# Patient Record
Sex: Female | Born: 1950 | Hispanic: No | Marital: Single
Health system: Southern US, Community
[De-identification: ages and names within clinical notes are randomized; demographics above are authoritative.]

---

## 2006-07-30 ENCOUNTER — Ambulatory Visit: Payer: Self-pay

## 2008-08-11 ENCOUNTER — Ambulatory Visit: Payer: Self-pay | Admitting: Unknown Physician Specialty

## 2009-03-22 ENCOUNTER — Ambulatory Visit: Payer: Self-pay | Admitting: Gastroenterology

## 2009-07-26 ENCOUNTER — Ambulatory Visit: Payer: Self-pay | Admitting: Unknown Physician Specialty

## 2010-07-30 ENCOUNTER — Ambulatory Visit: Payer: Self-pay | Admitting: Unknown Physician Specialty

## 2010-12-16 ENCOUNTER — Ambulatory Visit: Payer: Self-pay | Admitting: Oncology

## 2010-12-19 ENCOUNTER — Ambulatory Visit: Payer: Self-pay | Admitting: Oncology

## 2011-01-02 ENCOUNTER — Ambulatory Visit: Payer: Self-pay | Admitting: Oncology

## 2011-04-10 ENCOUNTER — Ambulatory Visit: Payer: Self-pay | Admitting: Radiation Oncology

## 2011-04-14 ENCOUNTER — Ambulatory Visit: Payer: Self-pay | Admitting: Radiation Oncology

## 2011-05-04 ENCOUNTER — Ambulatory Visit: Payer: Self-pay | Admitting: Radiation Oncology

## 2011-06-04 ENCOUNTER — Ambulatory Visit: Payer: Self-pay | Admitting: Oncology

## 2011-06-04 ENCOUNTER — Ambulatory Visit: Payer: Self-pay | Admitting: Radiation Oncology

## 2011-07-05 ENCOUNTER — Ambulatory Visit: Payer: Self-pay | Admitting: Radiation Oncology

## 2011-07-22 ENCOUNTER — Ambulatory Visit: Payer: Self-pay | Admitting: Oncology

## 2011-08-04 ENCOUNTER — Ambulatory Visit: Payer: Self-pay | Admitting: Radiation Oncology

## 2011-09-04 ENCOUNTER — Ambulatory Visit: Payer: Self-pay | Admitting: Radiation Oncology

## 2011-10-04 ENCOUNTER — Ambulatory Visit: Payer: Self-pay | Admitting: Radiation Oncology

## 2011-11-04 ENCOUNTER — Ambulatory Visit: Payer: Self-pay | Admitting: Oncology

## 2011-11-04 ENCOUNTER — Ambulatory Visit: Payer: Self-pay | Admitting: Radiation Oncology

## 2011-11-07 ENCOUNTER — Ambulatory Visit: Payer: Self-pay | Admitting: Oncology

## 2011-11-07 DIAGNOSIS — Z01810 Encounter for preprocedural cardiovascular examination: Secondary | ICD-10-CM

## 2011-11-10 LAB — CBC CANCER CENTER
Basophil #: 0 x10 3/mm (ref 0.0–0.1)
Eosinophil #: 0.1 x10 3/mm (ref 0.0–0.7)
Lymphocyte #: 1.2 x10 3/mm (ref 1.0–3.6)
Lymphocyte %: 23.7 %
MCHC: 34.7 g/dL (ref 32.0–36.0)
MCV: 84 fL (ref 80–100)
Monocyte %: 5.1 %
Neutrophil %: 68.6 %
Platelet: 161 x10 3/mm (ref 150–440)
RBC: 4.05 10*6/uL (ref 3.80–5.20)
RDW: 14.7 % — ABNORMAL HIGH (ref 11.5–14.5)
WBC: 5.1 x10 3/mm (ref 3.6–11.0)

## 2011-11-10 LAB — COMPREHENSIVE METABOLIC PANEL
Alkaline Phosphatase: 135 U/L (ref 50–136)
Anion Gap: 11 (ref 7–16)
BUN: 18 mg/dL (ref 7–18)
Bilirubin,Total: 0.9 mg/dL (ref 0.2–1.0)
Calcium, Total: 10.1 mg/dL (ref 8.5–10.1)
Chloride: 101 mmol/L (ref 98–107)
Co2: 27 mmol/L (ref 21–32)
EGFR (African American): 50 — ABNORMAL LOW
EGFR (Non-African Amer.): 42 — ABNORMAL LOW
Glucose: 97 mg/dL (ref 65–99)
Osmolality: 279 (ref 275–301)
Potassium: 3.7 mmol/L (ref 3.5–5.1)
SGOT(AST): 21 U/L (ref 15–37)
Total Protein: 7.9 g/dL (ref 6.4–8.2)

## 2011-11-10 LAB — MAGNESIUM: Magnesium: 1.7 mg/dL — ABNORMAL LOW

## 2011-11-10 LAB — PHOSPHORUS: Phosphorus: 3.1 mg/dL (ref 2.5–4.9)

## 2011-12-05 ENCOUNTER — Ambulatory Visit: Payer: Self-pay | Admitting: Oncology

## 2011-12-05 ENCOUNTER — Ambulatory Visit: Payer: Self-pay | Admitting: Radiation Oncology

## 2011-12-23 LAB — CBC CANCER CENTER
Basophil %: 0.2 %
Eosinophil %: 1.1 %
HCT: 35.3 % (ref 35.0–47.0)
HGB: 12.1 g/dL (ref 12.0–16.0)
Lymphocyte #: 1 x10 3/mm (ref 1.0–3.6)
MCV: 83 fL (ref 80–100)
Monocyte %: 4.5 %
Neutrophil %: 75.3 %
Platelet: 145 x10 3/mm — ABNORMAL LOW (ref 150–440)
RBC: 4.23 10*6/uL (ref 3.80–5.20)

## 2011-12-23 LAB — COMPREHENSIVE METABOLIC PANEL
Albumin: 4.4 g/dL (ref 3.4–5.0)
Bilirubin,Total: 1.1 mg/dL — ABNORMAL HIGH (ref 0.2–1.0)
Calcium, Total: 9.9 mg/dL (ref 8.5–10.1)
Chloride: 102 mmol/L (ref 98–107)
Co2: 29 mmol/L (ref 21–32)
Creatinine: 1.22 mg/dL (ref 0.60–1.30)
EGFR (African American): 58 — ABNORMAL LOW
EGFR (Non-African Amer.): 48 — ABNORMAL LOW
Osmolality: 286 (ref 275–301)
SGOT(AST): 23 U/L (ref 15–37)
SGPT (ALT): 31 U/L
Sodium: 141 mmol/L (ref 136–145)

## 2011-12-23 LAB — T4, FREE: Free Thyroxine: 1.2 ng/dL (ref 0.76–1.46)

## 2011-12-23 LAB — TSH: Thyroid Stimulating Horm: 2.57 u[IU]/mL

## 2011-12-23 LAB — MAGNESIUM: Magnesium: 1.8 mg/dL

## 2011-12-23 LAB — PHOSPHORUS: Phosphorus: 2.9 mg/dL (ref 2.5–4.9)

## 2012-01-02 ENCOUNTER — Ambulatory Visit: Payer: Self-pay | Admitting: Internal Medicine

## 2012-01-02 ENCOUNTER — Ambulatory Visit: Payer: Self-pay | Admitting: Radiation Oncology

## 2012-01-02 ENCOUNTER — Ambulatory Visit: Payer: Self-pay | Admitting: Oncology

## 2012-01-12 ENCOUNTER — Inpatient Hospital Stay: Payer: Self-pay | Admitting: Internal Medicine

## 2012-01-12 LAB — COMPREHENSIVE METABOLIC PANEL
BUN: 24 mg/dL — ABNORMAL HIGH (ref 7–18)
Bilirubin,Total: 1.2 mg/dL — ABNORMAL HIGH (ref 0.2–1.0)
Chloride: 104 mmol/L (ref 98–107)
Co2: 24 mmol/L (ref 21–32)
Creatinine: 1.04 mg/dL (ref 0.60–1.30)
EGFR (African American): >60
Glucose: 102 mg/dL — ABNORMAL HIGH (ref 65–99)
Osmolality: 282 (ref 275–301)
SGPT (ALT): 28 U/L
Sodium: 139 mmol/L (ref 136–145)
Total Protein: 7.7 g/dL (ref 6.4–8.2)

## 2012-01-12 LAB — CBC WITH DIFFERENTIAL/PLATELET
Basophil #: 0 10*3/uL (ref 0.0–0.1)
Eosinophil #: 0.1 10*3/uL (ref 0.0–0.7)
HCT: 33 % — ABNORMAL LOW (ref 35.0–47.0)
Lymphocyte #: 1 10*3/uL (ref 1.0–3.6)
Lymphocyte %: 20.1 %
MCHC: 34.4 g/dL (ref 32.0–36.0)
MCV: 84 fL (ref 80–100)
Monocyte %: 5.3 %
Neutrophil #: 3.7 10*3/uL (ref 1.4–6.5)
Platelet: 142 10*3/uL — ABNORMAL LOW (ref 150–440)
RDW: 15 % — ABNORMAL HIGH (ref 11.5–14.5)

## 2012-01-12 LAB — URINALYSIS, COMPLETE
Bacteria: NONE SEEN
Blood: NEGATIVE
Glucose,UR: NEGATIVE mg/dL (ref 0–75)
Nitrite: NEGATIVE
Protein: NEGATIVE
RBC,UR: 2 /HPF (ref 0–5)
Specific Gravity: 1.012 (ref 1.003–1.030)
Squamous Epithelial: 9

## 2012-01-13 LAB — CBC WITH DIFFERENTIAL/PLATELET
Basophil #: 0 10*3/uL (ref 0.0–0.1)
Basophil %: 0.2 %
HCT: 31.8 % — ABNORMAL LOW (ref 35.0–47.0)
Lymphocyte #: 0.4 10*3/uL — ABNORMAL LOW (ref 1.0–3.6)
Lymphocyte %: 11.5 %
MCV: 85 fL (ref 80–100)
Monocyte #: 0 10*3/uL (ref 0.0–0.7)
Monocyte %: 0.3 %
Neutrophil #: 3.1 10*3/uL (ref 1.4–6.5)
Neutrophil %: 88 %
Platelet: 139 10*3/uL — ABNORMAL LOW (ref 150–440)
RBC: 3.75 10*6/uL — ABNORMAL LOW (ref 3.80–5.20)
RDW: 14.9 % — ABNORMAL HIGH (ref 11.5–14.5)
WBC: 3.6 10*3/uL (ref 3.6–11.0)

## 2012-01-13 LAB — BASIC METABOLIC PANEL
Anion Gap: 15 (ref 7–16)
Co2: 22 mmol/L (ref 21–32)
Creatinine: 1.27 mg/dL (ref 0.60–1.30)
EGFR (African American): 55 — ABNORMAL LOW
Glucose: 147 mg/dL — ABNORMAL HIGH (ref 65–99)
Osmolality: 289 (ref 275–301)
Potassium: 3.7 mmol/L (ref 3.5–5.1)
Sodium: 141 mmol/L (ref 136–145)

## 2012-01-13 LAB — MAGNESIUM: Magnesium: 1.7 mg/dL — ABNORMAL LOW

## 2012-01-13 LAB — PROTIME-INR: INR: 1

## 2012-01-14 LAB — CBC WITH DIFFERENTIAL/PLATELET
Basophil #: 0 10*3/uL (ref 0.0–0.1)
Eosinophil %: 0 %
HCT: 27 % — ABNORMAL LOW (ref 35.0–47.0)
Lymphocyte #: 0.3 10*3/uL — ABNORMAL LOW (ref 1.0–3.6)
MCV: 85 fL (ref 80–100)
Monocyte %: 1 %
Neutrophil %: 95.4 %
RBC: 3.16 10*6/uL — ABNORMAL LOW (ref 3.80–5.20)
WBC: 8.1 10*3/uL (ref 3.6–11.0)

## 2012-02-02 ENCOUNTER — Ambulatory Visit: Payer: Self-pay | Admitting: Oncology

## 2012-02-02 ENCOUNTER — Ambulatory Visit: Payer: Self-pay | Admitting: Internal Medicine

## 2012-02-02 ENCOUNTER — Ambulatory Visit: Payer: Self-pay | Admitting: Radiation Oncology

## 2012-02-06 LAB — COMPREHENSIVE METABOLIC PANEL
Albumin: 3.5 g/dL (ref 3.4–5.0)
Alkaline Phosphatase: 112 U/L (ref 50–136)
Anion Gap: 8 (ref 7–16)
BUN: 23 mg/dL — ABNORMAL HIGH (ref 7–18)
Bilirubin,Total: 0.7 mg/dL (ref 0.2–1.0)
Chloride: 104 mmol/L (ref 98–107)
Co2: 30 mmol/L (ref 21–32)
EGFR (Non-African Amer.): >60
Glucose: 102 mg/dL — ABNORMAL HIGH (ref 65–99)
SGOT(AST): 20 U/L (ref 15–37)
SGPT (ALT): 39 U/L
Sodium: 142 mmol/L (ref 136–145)

## 2012-02-06 LAB — CBC CANCER CENTER
Basophil #: 0 x10 3/mm (ref 0.0–0.1)
Basophil %: 0.7 %
Eosinophil #: 0.1 x10 3/mm (ref 0.0–0.7)
Eosinophil %: 5.2 %
HGB: 11.9 g/dL — ABNORMAL LOW (ref 12.0–16.0)
Lymphocyte #: 0.8 x10 3/mm — ABNORMAL LOW (ref 1.0–3.6)
Lymphocyte %: 34.5 %
Neutrophil #: 1.3 x10 3/mm — ABNORMAL LOW (ref 1.4–6.5)
Neutrophil %: 53.3 %
RBC: 3.99 10*6/uL (ref 3.80–5.20)
RDW: 16.6 % — ABNORMAL HIGH (ref 11.5–14.5)

## 2012-02-06 LAB — MAGNESIUM: Magnesium: 1.7 mg/dL — ABNORMAL LOW

## 2012-02-26 LAB — COMPREHENSIVE METABOLIC PANEL
Albumin: 3.4 g/dL (ref 3.4–5.0)
Alkaline Phosphatase: 109 U/L (ref 50–136)
BUN: 39 mg/dL — ABNORMAL HIGH (ref 7–18)
Co2: 27 mmol/L (ref 21–32)
Creatinine: 1 mg/dL (ref 0.60–1.30)
EGFR (Non-African Amer.): >60
Glucose: 128 mg/dL — ABNORMAL HIGH (ref 65–99)
Osmolality: 287 (ref 275–301)
Potassium: 3.9 mmol/L (ref 3.5–5.1)
Sodium: 138 mmol/L (ref 136–145)

## 2012-02-26 LAB — CBC CANCER CENTER
Basophil #: 0 x10 3/mm (ref 0.0–0.1)
Basophil %: 0.3 %
Eosinophil %: 0.3 %
HCT: 40.2 % (ref 35.0–47.0)
HGB: 13.4 g/dL (ref 12.0–16.0)
Lymphocyte #: 0.6 x10 3/mm — ABNORMAL LOW (ref 1.0–3.6)
Lymphocyte %: 3.9 %
MCH: 29.4 pg (ref 26.0–34.0)
MCV: 88 fL (ref 80–100)
Neutrophil #: 13.4 x10 3/mm — ABNORMAL HIGH (ref 1.4–6.5)
Platelet: 111 x10 3/mm — ABNORMAL LOW (ref 150–440)

## 2012-03-03 ENCOUNTER — Ambulatory Visit: Payer: Self-pay | Admitting: Internal Medicine

## 2012-03-03 ENCOUNTER — Ambulatory Visit: Payer: Self-pay | Admitting: Radiation Oncology

## 2012-03-03 ENCOUNTER — Ambulatory Visit: Payer: Self-pay | Admitting: Oncology

## 2012-03-12 LAB — CBC CANCER CENTER
Basophil #: 0 x10 3/mm (ref 0.0–0.1)
Basophil %: 0.7 %
Eosinophil #: 0.1 x10 3/mm (ref 0.0–0.7)
HCT: 31.1 % — ABNORMAL LOW (ref 35.0–47.0)
Lymphocyte #: 1.2 x10 3/mm (ref 1.0–3.6)
MCH: 30.5 pg (ref 26.0–34.0)
MCHC: 33.7 g/dL (ref 32.0–36.0)
MCV: 90 fL (ref 80–100)
Monocyte %: 4.5 %
Platelet: 110 x10 3/mm — ABNORMAL LOW (ref 150–440)
RBC: 3.45 10*6/uL — ABNORMAL LOW (ref 3.80–5.20)
WBC: 4.3 x10 3/mm (ref 3.6–11.0)

## 2012-03-12 LAB — COMPREHENSIVE METABOLIC PANEL
Albumin: 3.4 g/dL (ref 3.4–5.0)
Anion Gap: 12 (ref 7–16)
BUN: 15 mg/dL (ref 7–18)
Bilirubin,Total: 0.7 mg/dL (ref 0.2–1.0)
Calcium, Total: 9.1 mg/dL (ref 8.5–10.1)
Chloride: 108 mmol/L — ABNORMAL HIGH (ref 98–107)
Creatinine: 0.7 mg/dL (ref 0.60–1.30)
EGFR (Non-African Amer.): >60
Glucose: 129 mg/dL — ABNORMAL HIGH (ref 65–99)
SGOT(AST): 20 U/L (ref 15–37)
SGPT (ALT): 31 U/L
Sodium: 146 mmol/L — ABNORMAL HIGH (ref 136–145)

## 2012-03-12 LAB — IRON AND TIBC
Iron: 93 ug/dL (ref 50–170)
Unbound Iron-Bind.Cap.: 212 ug/dL

## 2012-03-12 LAB — MAGNESIUM: Magnesium: 1.4 mg/dL — ABNORMAL LOW

## 2012-03-12 LAB — FOLATE: Folic Acid: 19.1 ng/mL (ref 3.1–100.0)

## 2012-03-12 LAB — CHOLESTEROL, TOTAL: Cholesterol: 245 mg/dL — ABNORMAL HIGH (ref 0–200)

## 2012-03-12 LAB — FERRITIN: Ferritin (ARMC): 84 ng/mL (ref 8–388)

## 2012-04-03 ENCOUNTER — Ambulatory Visit: Payer: Self-pay | Admitting: Radiation Oncology

## 2012-04-03 ENCOUNTER — Ambulatory Visit: Payer: Self-pay | Admitting: Oncology

## 2012-04-19 ENCOUNTER — Inpatient Hospital Stay: Payer: Self-pay | Admitting: Internal Medicine

## 2012-04-19 LAB — COMPREHENSIVE METABOLIC PANEL
Albumin: 3.7 g/dL (ref 3.4–5.0)
Alkaline Phosphatase: 127 U/L (ref 50–136)
Anion Gap: 11 (ref 7–16)
BUN: 12 mg/dL (ref 7–18)
Bilirubin,Total: 1.7 mg/dL — ABNORMAL HIGH (ref 0.2–1.0)
Chloride: 106 mmol/L (ref 98–107)
Co2: 22 mmol/L (ref 21–32)
Creatinine: 0.97 mg/dL (ref 0.60–1.30)
EGFR (African American): >60
EGFR (Non-African Amer.): >60
Glucose: 95 mg/dL (ref 65–99)
Potassium: 3.5 mmol/L (ref 3.5–5.1)
SGOT(AST): 23 U/L (ref 15–37)
SGPT (ALT): 20 U/L
Total Protein: 7 g/dL (ref 6.4–8.2)

## 2012-04-19 LAB — CBC
HCT: 29.3 % — ABNORMAL LOW (ref 35.0–47.0)
HGB: 10.2 g/dL — ABNORMAL LOW (ref 12.0–16.0)
MCH: 30.5 pg (ref 26.0–34.0)
MCV: 87 fL (ref 80–100)
Platelet: 208 10*3/uL (ref 150–440)
RBC: 3.36 10*6/uL — ABNORMAL LOW (ref 3.80–5.20)
WBC: 5 10*3/uL (ref 3.6–11.0)

## 2012-04-19 LAB — URINALYSIS, COMPLETE
Bilirubin,UR: NEGATIVE
Glucose,UR: NEGATIVE mg/dL (ref 0–75)
Leukocyte Esterase: NEGATIVE
Protein: NEGATIVE
Specific Gravity: 1.01 (ref 1.003–1.030)
Squamous Epithelial: 7

## 2012-04-23 LAB — COMPREHENSIVE METABOLIC PANEL
Albumin: 4.2 g/dL (ref 3.4–5.0)
Alkaline Phosphatase: 123 U/L (ref 50–136)
Anion Gap: 13 (ref 7–16)
BUN: 25 mg/dL — ABNORMAL HIGH (ref 7–18)
Calcium, Total: 9.9 mg/dL (ref 8.5–10.1)
Co2: 24 mmol/L (ref 21–32)
Creatinine: 1.15 mg/dL (ref 0.60–1.30)
EGFR (Non-African Amer.): 52 — ABNORMAL LOW
Osmolality: 286 (ref 275–301)
SGPT (ALT): 25 U/L
Sodium: 138 mmol/L (ref 136–145)
Total Protein: 7.6 g/dL (ref 6.4–8.2)

## 2012-04-23 LAB — CBC CANCER CENTER
Eosinophil %: 0.1 %
HGB: 11.7 g/dL — ABNORMAL LOW (ref 12.0–16.0)
Lymphocyte #: 0.3 x10 3/mm — ABNORMAL LOW (ref 1.0–3.6)
Lymphocyte %: 5 %
MCH: 29.5 pg (ref 26.0–34.0)
MCV: 89 fL (ref 80–100)
Monocyte #: 0 x10 3/mm — ABNORMAL LOW (ref 0.2–0.9)
Monocyte %: 0.4 %
Platelet: 267 x10 3/mm (ref 150–440)
RBC: 3.96 10*6/uL (ref 3.80–5.20)
WBC: 6.6 x10 3/mm (ref 3.6–11.0)

## 2012-04-23 LAB — MAGNESIUM: Magnesium: 2.1 mg/dL

## 2012-04-23 LAB — PHOSPHORUS: Phosphorus: 3 mg/dL (ref 2.5–4.9)

## 2012-05-03 ENCOUNTER — Ambulatory Visit: Payer: Self-pay | Admitting: Oncology

## 2012-05-03 ENCOUNTER — Ambulatory Visit: Payer: Self-pay | Admitting: Radiation Oncology

## 2012-05-05 LAB — CBC CANCER CENTER
Basophil #: 0 x10 3/mm (ref 0.0–0.1)
Basophil %: 0.2 %
Eosinophil %: 0 %
Lymphocyte %: 1.5 %
MCHC: 33.1 g/dL (ref 32.0–36.0)
MCV: 89 fL (ref 80–100)
Monocyte #: 0.2 x10 3/mm (ref 0.2–0.9)
Monocyte %: 1.5 %
Neutrophil %: 96.8 %
Platelet: 153 x10 3/mm (ref 150–440)

## 2012-05-05 LAB — COMPREHENSIVE METABOLIC PANEL
Albumin: 4 g/dL (ref 3.4–5.0)
Bilirubin,Total: 1.6 mg/dL — ABNORMAL HIGH (ref 0.2–1.0)
Calcium, Total: 9.5 mg/dL (ref 8.5–10.1)
Chloride: 98 mmol/L (ref 98–107)
Creatinine: 1.02 mg/dL (ref 0.60–1.30)
EGFR (African American): >60
EGFR (Non-African Amer.): 60 — ABNORMAL LOW
Glucose: 101 mg/dL — ABNORMAL HIGH (ref 65–99)
Osmolality: 275 (ref 275–301)
Potassium: 4.1 mmol/L (ref 3.5–5.1)
SGOT(AST): 16 U/L (ref 15–37)
SGPT (ALT): 28 U/L
Sodium: 133 mmol/L — ABNORMAL LOW (ref 136–145)
Total Protein: 7.1 g/dL (ref 6.4–8.2)

## 2012-06-03 ENCOUNTER — Ambulatory Visit: Payer: Self-pay | Admitting: Radiation Oncology

## 2012-06-03 ENCOUNTER — Ambulatory Visit: Payer: Self-pay | Admitting: Oncology

## 2012-07-04 DEATH — deceased

## 2013-02-10 IMAGING — CT NM PET TUM IMG RESTAG (PS) SKULL BASE T - THIGH
1 of 5 series · 1 of 25 positions shown · non-contrast
Comparison: none

REASON FOR EXAM: recurrence in brain, access for any other site of
recurrence.  primary lesion Subramaniam
COMMENTS:

PROCEDURE:     PET - PET/CT MELANOMA RESTG WB  - January 14, 2012  [DATE]
RESULT:
HISTORY: Metastatic disease. Melanoma.
Comparison Study: Prior PET/CT of 11/07/2011.

[Series 3: ct wb 3.0 b30f · axial · 3.0mm · 0.98mm/px · 1 of 494 slices shown]
[im 444/494  brain]
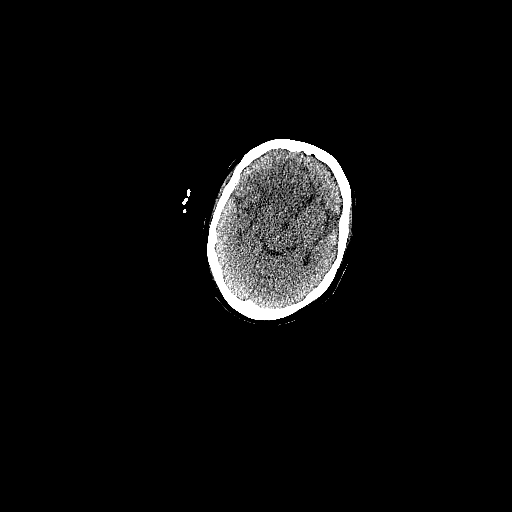

[1 of 25 positions shown; findings below may reference images not displayed]

FINDINGS: Following determination of a fasting blood sugar of 129 mg/dL,
12.6 mCi of F18 FDG was administered. PET/CT was obtained of the whole body
and extremities. CT was obtained for attenuation correction and fusion. An
equivocal lesion with a maximum SUV of 1.9 is noted in the medial right
thigh skin. A similar finding was noted on prior examination. This could be
from prior surgery or a focal lesion such as melanoma. Muscle activity is
noted in the neck. No other focal abnormality is identified. No bony
abnormality.
IMPRESSION: 1. Persistent focus of increased FDG uptake in the medial thigh skin/soft
tissues. This may represent prior site of surgery and/or inflammatory
change. A melanoma in this region again cannot be excluded.
2. No other significant abnormality is identified.

## 2015-02-25 NOTE — H&P (Signed)
PATIENT NAME:  Hailey Stanley, Hailey Stanley MR#:  [id number] DATE OF BIRTH:  06/13/1951  DATE OF ADMISSION:  04/19/2012  REFERRING PHYSICIAN: ER physician, Dr. Hassell Done PRIMARY CARE PHYSICIAN: Dr. Jeananne Rama  ONCOLOGIST: Dr. Grayland Ormond RADIATION ONCOLOGIST: Dr. Donella Stade  CHIEF COMPLAINT: New onset right lower extremity weakness.   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 Patient was last admitted to [facility] March 2013 for vasogenic edema due to brain metastasis. 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 Patient reports that she was doing well after her surgery. She is on oral chemotherapy medication, Zelboraf. She normally ambulates without any assistance but for the last four days has been having progressively worsening weakness in her right lower extremity to the point that she is unable to ambulate therefore, she came to the Emergency Room.   ALLERGIES: Penicillin.   PAST MEDICAL HISTORY:  1. 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  2. Hypertension and hyperlipidemia. The patient is currently not on any treatment for any of these.  3. Electrolyte abnormalities with low magnesium and potassium due to chemotherapy. Patient is currently only oral magnesium replacement. 4. Neuropathy.  5. Urinary incontinence.   PAST SURGICAL HISTORY:  1. Melanoma removed from right leg. 2. Some tumor removed from a popliteal fossae from the back of her right knee. 3. 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   FAMILY HISTORY: Mother had colon cancer. Father had some unknown type of cancer.   SOCIAL HISTORY: Denies any history of smoking, alcohol or drug abuse. She used to work as a Quarry manager at [employer]. She lives with a friend.   CURRENT MEDICATIONS:  1. Zelboraf 240 mg 2 tablets b.i.d.  2. Magnesium 64, 2 tablets daily.   REVIEW OF SYSTEMS: CONSTITUTIONAL: Denies any fever. Reports fatigue, weakness. EYES: Denies any blurred or double vision. ENT: Denies any tinnitus or ear pain. RESPIRATORY: Denies any cough, wheezing. CARDIOVASCULAR: Denies any chest pain, palpitations. GASTROINTESTINAL: Denies any nausea, vomiting, diarrhea, abdominal pain. GENITOURINARY: Denies any dysuria, hematuria. ENDOCRINE: Denies any polyuria, nocturia. HEME/LYMPH: Denies any anemia, easy bruisability. INTEGUMENT: Denies any acne, rash. MUSCULOSKELETAL: Reports right lower extremity weakness. Denies any redness, gout. NEUROLOGICAL: Reports right lower extremity weakness. Denies any seizure or tremors. PSYCH: Denies any anxiety, depression.   PHYSICAL EXAMINATION:  VITAL SIGNS: Temperature 98.3, heart rate 82, respiratory rate 20, blood pressure 151/74, pulse ox 94% on room air.   GENERAL: Patient is a 64 year old female who appears chronically ill sitting in bed, not in acute distress.   HEAD: Atraumatic, normocephalic.   EYES: There is some pallor. No icterus or cyanosis. Pupils are equal, round, and reactive to light and accommodation. Extraocular movements are intact.    ENT: Wet mucous membranes. No oropharyngeal erythema or thrush.   FACE: Patient has some facial swelling.   NECK: Supple. No masses. No JVD. No thyromegaly.   CHEST WALL: No tenderness to palpation. Not using accessory  muscles of respiration. No intercostal muscle retraction.   LUNGS: Bilaterally clear to auscultation. No wheezing,  rales, or rhonchi.   CARDIOVASCULAR: S1, S2 regular. No murmur, rubs, or gallops.   ABDOMEN: Soft, nontender, nondistended. No guarding. No rigidity. No organomegaly. Normal bowel sounds.   SKIN: No rashes or lesions.   PERIPHERIES: She has scars from her previous surgery on her right leg. There is some trace pedal edema, 2+ pedal pulses.   MUSCULOSKELETAL: No cyanosis or clubbing.   NEUROLOGICAL: Patient is awake, alert, oriented x3. Patient has generalized weakness. Motor strength is 3/5 in her right lower extremity. Cranial nerves are grossly intact.   PSYCH: Patient has a flat affect but is oriented x3.   LABORATORY, DIAGNOSTIC AND RADIOLOGICAL DATA: CBC normal other than hemoglobin of 10.2. Complete metabolic panel normal other than bilirubin of 1.7. CT of the brain shows large area of decreased density in the right frontal lobe extending across the midline into the left frontal lobe, slightly more conspicuous since the previous surgery. The degree of mass effect upon the frontal horn of the right lateral ventricle is slightly less. There is minimal shift of cerebrum anteriorly by 2 to 3 mm towards the left. No acute intracranial hemorrhage.   ASSESSMENT AND PLAN: 64 year old female with past medical history of metastatic melanoma presents with new onset of right lower extremity weakness.  1. Right lower extremity weakness, could be related to her metastatic melanoma with frontal mass. 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 Discussed with Dr. Grayland Ormond. Will admit the patient to the hospital and started on high dose steroids. Will also obtain a PT consultation and MRI of the brain. Dr. Grayland Ormond is going to try and contact patient's physicians at [facility] to get more information.  2. History of hypertension. Patient is currently not on any  medication for that. Will continue to monitor closely.  3. Hyperlipidemia. Patient is not on any medications for her hyperlipidemia either.  4. Metastatic melanoma. Will continue patient's Zelboraf. Patient may take her own medication.  5. Anemia, likely of chronic disease.  6. Electrolyte abnormalities. Patient had hypokalemia and hypomagnesemia related to her chemotherapy in the past. She is currently only on magnesium supplementation, will continue that.  7. CODE STATUS: Discussed with the patient in the presence of her sister. Patient is a DO NOT RESUSCITATE. Due to her underlying medical condition she is at very high risk for complications and cardiopulmonary arrest and the family is aware of her grave prognosis.   Reviewed all medical records, discussed with Dr. Grayland Ormond, discussed with the patient and her sister the plan of care and management.   TIME SPENT: 75 minutes.   ____________________________ Cherre Huger, MD sp:cms D: 04/19/2012 11:39:59 ET T: 04/19/2012 12:11:29 ET JOB#: [id number]  cc: Cherre Huger, MD, <Dictator> Guadalupe Maple, MD  Cherre Huger MD ELECTRONICALLY SIGNED 04/19/2012 16:09

## 2015-02-25 NOTE — Discharge Summary (Signed)
PATIENT NAME:  Hailey PhilipsRICKMAN, Fina MR#:  [id number] DATE OF BIRTH:  01-15-1951  DATE OF ADMISSION:  04/19/2012 DATE OF DISCHARGE:  04/20/2012  DISCHARGE DIAGNOSES:  1. Right lower extremity weakness possibly due to metastatic melanoma. 2. History of hypertension, not on any meds. 3. History of  hyperlipidemia, not on any meds. 4. Metastatic melanoma.  5. Anemia of chronic disease. 6. Electrolyte abnormalities including low magnesium on oral supplementation.   DISPOSITION: The patient is being discharged home with PT.   FOLLOW-UP:  1. Follow-up with Dr. Orlie DakinFinnegan within two weeks.   2. Follow-up with PCP, Dr. Dossie Arbourrissman, in 1 to 2 weeks after discharge.   DIET: Regular.  ACTIVITY: As tolerated.   DISCHARGE MEDICATIONS:  1. Zelboraf 240 mg 2 tablets b.i.d.  2. Mag 64 1122 mg 2 tablets once a day.   CONSULTATION: Oncology consultation with Dr. Orlie DakinFinnegan    LABORATORY, DIAGNOSTIC, AND RADIOLOGICAL DATA: CT of the head showed there is a large area of decreased density in the right frontal lobe extending across the midline into the left frontal lobe more conspicuous since the previous study. Degree of mass effect on the frontal horn of the right lateral ventricle is less. Minimal shift of the cerebrum anteriorly by 2 to 3 mm towards the left.  MRI of the brain showed persistent large right frontal enhancing irregular mass consistent with primary or metastatic tumor including melanoma. Prior craniotomy on the right. Significant reduction in edema in the right frontal lobe from prior scan, however, prominent edema does remain. Midline shift on today's exam is minimal. Ventricular lead compression on today's exam is minimal.   MRI of the lumbar spine showed multilevel mild disk degeneration. No evidence of disk protrusion or spinal stenosis. No evidence of metastatic disease or enhancing abnormality. Lumbar cord normal.   Urinalysis showed no evidence of infection. CBC normal other than hemoglobin  10.2. CMP essentially normal.   HOSPITAL COURSE: The patient is a 64 year old female with past medical history of metastatic melanoma who [facility] had [facility] at which time she had resection of her right frontal lesion which was found to be tumor necrosis with some surrounding melanoma. The patient had done well after her surgery but came back because of right lower extremity weakness and inability to walk. There was a concern for metastatic lesions to the brain and to the lumbar spine, therefore, the patient was admitted to the hospital and MRI of the brain and MRI of the spine were done. MRI of the brain showed stable findings of right frontal mass with some associated edema. The MRI of the lumbar spine showed no abnormalities. The patient was initially treated with high dose steroids and is being discharged home on a steroid taper. The patient was evaluated by physical therapy during the hospitalization and she did ambulate with them without any assistance. The patient refused home health but accepted home physical therapy which has been arranged for the patient. The patient was also evaluated by Dr. Orlie DakinFinnegan during the hospitalization. He will follow-up with her within two weeks. The rest of the patient's medical problems remained stable. She is being discharged home in stable condition.  TIME SPENT: 45 minutes.   ____________________________ Darrick MeigsSangeeta Surafel Hilleary, MD sp:drc D: 04/20/2012 15:38:14 ET T: 04/20/2012 17:27:51 ET JOB#: [id number]  cc: Darrick MeigsSangeeta Arthi Mcdonald, MD, <Dictator> Darrick MeigsSANGEETA Vance Hochmuth MD ELECTRONICALLY SIGNED 04/22/2012 13:18

## 2015-02-25 NOTE — Discharge Summary (Signed)
PATIENT NAME:  Hailey PhilipsRICKMAN, Mieshia MR#:  [id number] DATE OF BIRTH:  1951/01/04  DATE OF ADMISSION:  01/12/2012 DATE OF DISCHARGE:  01/15/2012  DISCHARGE DIAGNOSES:  1. Metastatic carcinoma with mets to brain resulting in cerebral edema on Decadron and improving.  2. Suspected metastatic melanoma. PET scan negative. Will require outpatient followup with Dr. Orlie DakinFinnegan.  3. Hypokalemia, repleted and resolved.  4. Hypomagnesemia, repleted and resolved.  5. Nausea and vomiting, now resolved. Tolerating diet. 6. Hypotension, now resolved, likely due to dehydration.  7. Hyperlipidemia, on Crestor.  8. Lower extremity pain likely due to neuropathy.  9. Urinary incontinence likely from underlying mets and spinal disease. Recommend wearing diapers for now.   SECONDARY DIAGNOSES:  1. Hypertension.  2. Hyperlipidemia.   CONSULTATION:  1. Oncology, Dr. Orlie DakinFinnegan.  2. Palliative care, Dr. Harvie JuniorPhifer.  3. Physical therapy.   PROCEDURES/RADIOLOGY:  1. PET scan on 03/13 showed persistent focus of increased FDG uptake in the medial thighs skin/soft tissue. No other significant abnormality.  2. CT scan of the head without contrast on 03/11 showed a large area of abnormal decreased density in the right frontal lobe most compatible with edema due to an underlying mass. No evidence of acute intracranial hemorrhage. No abnormal intracranial calcification or hyperdense  foci.  3. MRI of the brain with and without contrast on 03/11 showed a large irregular area of enhancement and associated vasogenic edema in the right frontal lobe with midline shift to the left. Possible metastatic disease.  Multiple foci of susceptibility artifact in the region of sulci of bilateral frontal and parietal lobes of uncertain etiology.  4. Chest x-ray on 03/11 showed bilateral pulmonary hypoinflation. No acute cardiopulmonary disease.   MAJOR LABORATORY PANEL: Urinalysis on admission was negative.   HISTORY AND SHORT HOSPITAL COURSE: The  patient is a 64 year old female with the above-mentioned medical problems, admitted for vasogenic cerebral edema from underlying brain metastases. She was started on IV Decadron and oncology consultation was obtained. Please see Dr. Margaretmary EddyShah's dictated History and Physical for further details. The patient was also evaluated by palliative care for long-term goals and CODE STATUS verification. She was made DO NOT RESUSCITATE early on. She was also started on Keppra, although subsequently it was discontinued. There was a concern for possible seizure at home although the patient did not want to continue any  more medication than what she was already on. She did not have any more seizures while in the hospital. She also had some electrolyte abnormalities including low potassium and magnesium.  These were replaced and resolved. She had nausea and vomiting also, which resolved, and she was a tolerating diet fine. Her hypotension was also resolved with good IV hydration. She was continued on Decadron and oncology recommended outpatient followup with them. As the patient was feeling much better on 03/14 she was discharged home in stable condition.    VITAL SIGNS: On the date of discharge her vital signs were as follows: Temperature 97.8, heart rate 66 per minute, respirations 20 per minute, blood pressure 109/61 mmHg.  She was saturating 96% on room air.   PHYSICAL EXAMINATION:  CARDIOVASCULAR: S1, S2 normal. No murmurs, rubs, or gallop. LUNGS: Clear to auscultation bilaterally. No wheezing, rales, rhonchi, or crepitation. ABDOMEN: Soft, benign. NEUROLOGIC: Nonfocal examination. All other physical examination remained at baseline.   DISCHARGE MEDICATIONS:  1. Zelboraf  240 mg, 2 tablets p.o. b.i.d.  2. Magnesium 2 tablets p.o. daily.  3. Potassium chloride 20 mEq p.o. daily.  4. Crestor 10  mg p.o. daily. 5. Decadron 10 mg p.o. every six hours.   DISCHARGE DIET: Regular.   DISCHARGE ACTIVITY: As tolerated.    DISCHARGE INSTRUCTIONS AND FOLLOWUP:  1. The patient was instructed to follow up with her primary care physician, Dr. Vonita Moss, in 1 to 2 weeks.  2. She will need followup with Dr. Orlie Dakin from oncology the week of 03/18 and had an appointment made for 03/19 at 3:30 p.m.   3. She was set up to get Home Health by care manager along with physical therapy as per physical therapy recommendation while in the hospital.   TOTAL TIME DISCHARGING THIS PATIENT: 55 minutes.   ____________________________ Ellamae Sia. Sherryll Burger, MD vss:bjt D: 01/19/2012 10:48:19 ET T: 01/20/2012 11:38:05 ET JOB#: [id number]  cc: Leeanne Butters S. Sherryll Burger, MD, <Dictator> Steele Sizer, MD Tollie Pizza. Orlie Dakin, MD Ned Grace, MD Ellamae Sia [facility] MD ELECTRONICALLY SIGNED 01/20/2012 19:59

## 2015-02-25 NOTE — Consult Note (Signed)
History of Present Illness:   Reason for Consult Stage IV melanoma now with new neurologic symptoms.    HPI   Patient last evaluated in clinic on Mar 12, 2012.  Patient was doing well up until approximately one week ago when she started having difficulty walking stating "my legs do not work".  She also by report has had some urinary incontinence as well as episodes of confusion. She has no other neurologic complaints.  She has a good appetite and has maintained her weight. She has had no fevers or recent illnesses.  She denies any chest pain or shortness of breath.  She denies any nausea, vomiting, constipation, or diarrhea.  She has no urinary complaints.  She denies any pain.  Patient offers no further specific complaints today.  PFSH:   Additional Past Medical and Surgical History Past medical history: Hyperlipidemia, hypertension.  Past surgical history: Melanoma removal.  Family history: Mother with colon cancer.  Social history: Patient denies tobacco or alcohol.  Works as a Geneticist, molecular.   Review of Systems:   Performance Status (ECOG) 3    Review of Systems   As per HPI. Otherwise, 10 point system review was negative.  NURSING NOTES: **Vital Signs.:   17-Jun-13 13:02    Vital Signs Type: Admission    Temperature Temperature (F): 98    Celsius: 36.6    Temperature Source: oral    Pulse Pulse: 78    Respirations Respirations: 18    Systolic BP Systolic BP: 179    Diastolic BP (mmHg) Diastolic BP (mmHg): 80    Mean BP: 101    BP Source: vital sign device    Pulse Ox % Pulse Ox %: 95    Pulse Ox Activity Level: At rest    Oxygen Delivery: Room Air/ 21 %   Physical Exam:   Physical Exam General: Ill-appearing, no acute distress. Eyes: Pink conjunctiva, anicteric sclera. Lungs: Clear to auscultation bilaterally. Heart: Regular rate and rhythm. No rubs, murmurs, or gallops. Abdomen: Soft, nontender, nondistended. No organomegaly noted,  normoactive bowel sounds. Musculoskeletal: No edema, cyanosis, or clubbing. Neuro: Alert, answering all questions appropriately. Cranial nerves grossly intact. Skin: No rashes or petechiae noted. Psych: Normal affect. Lymphatics: No cervical, calvicular, axillary or inguinal LAD.    Penicillin: Hives    Mag64 112 mg-64 mg tablet: 2 tab(s) orally once a day x 30 days, Active, 60, 5   Zelboraf 240 mg oral tablet: 2 tab(s) orally every 12 hours, Active, 0, None  Laboratory Results: Hepatic:  17-Jun-13 08:50    Bilirubin, Total  1.7   Alkaline Phosphatase 127   SGPT (ALT) 20 (12-78 NOTE: NEW REFERENCE RANGE 09/26/2011)   SGOT (AST) 23   Total Protein, Serum 7.0   Albumin, Serum 3.7  Routine Chem:  17-Jun-13 08:50    Glucose, Serum 95   BUN 12   Creatinine (comp) 0.97   Sodium, Serum 139   Potassium, Serum 3.5   Chloride, Serum 106   CO2, Serum 22   Calcium (Total), Serum 9.3   Osmolality (calc) 277   eGFR (African American) >60   eGFR (Non-African American) >60 (eGFR values <2m/min/1.73 m2 may be an indication of chronic kidney disease (CKD). Calculated eGFR is useful in patients with stable renal function. The eGFR calculation will not be reliable in acutely ill patients when serum creatinine is changing rapidly. It is not useful in  patients on dialysis. The eGFR calculation may not be applicable to patients  at the low and high extremes of body sizes, pregnant women, and vegetarians.)   Anion Gap 11  Routine Hem:  17-Jun-13 08:50    WBC (CBC) 5.0   RBC (CBC)  3.36   Hemoglobin (CBC)  10.2   Hematocrit (CBC)  29.3   Platelet Count (CBC) 208 (Result(s) reported on 19 Apr 2012 at 09:21AM.)   MCV 87   MCH 30.5   MCHC 35.0   RDW  15.3   Assessment and Plan:  Impression:   Stage IV melanoma.  Plan:   1.  Melanoma:  Recent PET scan revealed no evidence of disease.  Neurosurgery approximately 6 weeks ago confirmed only radiation necrosis with no active  malignancy.  Patient has been instructed to hold Zelboraf until her acute symptoms resolve.  Agree with repeat MRI the brain as well as high-dose Decadron.  Will also order an MRI of her lumbar spine for completeness.  Finally, will consider a restaging PET scan if the above studies are unrevealing. Hypomagnesemia: Continue oral supplementation. consult, will follow.   Electronic Signatures: Delight Hoh (MD)  (Signed 17-Jun-13 14:52)  Authored: HISTORY OF PRESENT ILLNESS, PFSH, ROS, NURSING NOTES, PE, ALLERGIES, HOME MEDICATIONS, LABS, ASSESSMENT AND PLAN   Last Updated: 17-Jun-13 14:52 by Delight Hoh (MD)

## 2015-02-25 NOTE — H&P (Signed)
PATIENT NAME:  [name], Hailey MR#:  [id number] DATE OF BIRTH:  [birth year]  DATE OF ADMISSION:  01/12/2012  ADDENDUM:   Copy to the patient's primary care physician, Dr. Vonita MossMark Crissman, Dr. Orlie DakinFinnegan, and [facility].   ____________________________ Ellamae SiaVipul S. Sherryll BurgerShah, MD vss:drc D: 01/12/2012 19:53:19 ET T: 01/13/2012 08:16:56 ET JOB#: [id number]  cc: Darlyn Repsher S. Sherryll BurgerShah, MD, <Dictator> Ellamae SiaVIPUL S [facility] MD ELECTRONICALLY SIGNED 01/13/2012 15:17

## 2015-02-25 NOTE — H&P (Signed)
PATIENT NAME:  Hailey PhilipsRICKMAN, Brannon MR#:  [id number] DATE OF BIRTH:  [birth year]  DATE OF ADMISSION:  01/12/2012  PRIMARY CARE PHYSICIAN: Vonita MossMark Crissman, MD   ONCOLOGY: Dr. Orlie DakinFinnegan and [facility]    REQUESTING PHYSICIAN: Dorothea GlassmanPaul Malinda, MD   CHIEF COMPLAINT: Vomiting and headache.   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 The patient has been having headache intermittently for several weeks, started having vomiting for the last two days and was feeling very weak, was unable to keep anything orally down. She called Dr. Milinda CaveFinnegan's office who requested her to come to the Emergency Department. She vomited twice this morning. She has received a total of 12 doses of radiation for her cancer. She has received a total of 12 radiation treatments to her brain for her metastatic cancer and has also been receiving Zelboraf as prescribed by Dr. Orlie DakinFinnegan for her melanoma. While in the ED she underwent MRI of the brain which shows a large irregular area of enhancement with associated vasogenic edema in the right frontal lobe with midline shift to the left worrisome for metastatic disease.   She is being admitted for further evaluation and management. The patient prefers to stay here in this hospital and Dr. Orlie DakinFinnegan also requested the same for which she is being admitted.   PAST MEDICAL HISTORY:  1. Hyperlipidemia.  2. Hypertension.   PAST SURGICAL HISTORY: Melanoma removal from her right leg and some tumor removed from popliteal foci from back of her right knee.   FAMILY HISTORY: Mother with colon cancer.   SOCIAL HISTORY: No tobacco. No alcohol. 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   ALLERGIES: Penicillin.   MEDICATIONS AT HOME:  1. Zelboraf 240 mg 2 tablets p.o. b.i.d.   2. Potassium chloride 20 mEq p.o. daily.  3. Mag 64 two tablets p.o. daily.  4. Hydrochlorothiazide/triamterene 25/37.5 one 1 tablet p.o. daily.  5. Crestor 10 mg p.o. daily.   REVIEW OF SYSTEMS: CONSTITUTIONAL: No fever. Positive fatigue and weakness. EYES: No blurry or double vision. ENT: No tinnitus or ear pain. RESPIRATORY: No cough, wheezing, hemoptysis. CARDIOVASCULAR: No chest pain, orthopnea, edema. GI: Positive for nausea and vomiting. No diarrhea. GU: No dysuria or hematuria. ENDOCRINE: No polyuria or nocturia. HEMATOLOGY: No anemia or easy bruising. SKIN: She has a scar from old surgery on her right leg. NEUROLOGIC: No tingling, numbness, weakness. Positive for headache. PSYCHIATRIC: Positive for possible anxiety. No depression.   PHYSICAL EXAMINATION:   VITAL SIGNS: Temperature 97.4, heart rate 72 per minute, respirations 18 per minute, blood pressure 119/74 mmHg. She is saturating 100% on room air.   GENERAL: The patient is a 64 year old female lying in the bed comfortably without any acute distress.   EYES: Pupils equal, round, reactive to light and accommodation. No scleral icterus. Scleral pallor present.    NECK: No jugular venous distention. No thyroid enlargement or tenderness.  LUNGS: Clear to auscultation bilaterally. No wheezes, rales, rhonchi, or crepitation.   CARDIOVASCULAR: S1, S2 normal. No murmurs, rubs, or gallop.   ABDOMEN: Soft, nontender, nondistended. Bowel sounds present. No organomegaly or mass.   EXTREMITIES: She has scar from her previous surgery on her right leg, well healed. No signs of infection. No cyanosis or clubbing.   NEUROLOGIC: Nonfocal examination. She is globally weak.  Strength 4 out of 5 in both lower extremities. Answering all questions appropriately although quite slow to respond. Cranial nerves III to XII intact. Sensation intact.  PSYCHIATRIC: The patient seems somewhat anxious. She is alert and oriented x3.   LABORATORY,  DIAGNOSTIC, AND RADIOLOGICAL DATA: Normal BMP. Normal liver function tests except total bilirubin of 1.2. Negative first set of cardiac enzymes. Normal CBC except hemoglobin 11.4, hematocrit 33.0, platelets 142. Negative urinalysis except 1+ leukocyte esterase, 8 WBCs, no bacteria seen.   CT scan of the head without contrast while in the ED showed a large area of abnormal decreased density in the right frontal lobe corresponding to MRI findings, most compatible with edema due to underlying mass. No evidence of acute intracranial hemorrhage. MRI of the brain showed large irregular area of enhancement and associated vasogenic edema in the right frontal lobe with midline shift to the left concerning for metastatic disease. Multiple foci of susceptibility artifact in the region of sulci of the bilateral frontal and parietal lobes of uncertain etiology. Chest x-ray showed no acute cardiopulmonary disease.   IMPRESSION AND PLAN:  1. Nausea, vomiting, and headache likely from cerebral edema and midline shift secondary to vasogenic edema from brain metastases. Will start her on IV Decadron 10 mg every six hours. Consult Oncology. Case discussed with Dr. Orlie Dakin who agrees.  2. Metastatic melanoma. She has mets to brain, lung, liver. Her MRI here shows midline shift to the left. This is worrisome for brain swelling. Will start on IV Decadron. Oncology and the patient feel comfortable to stay here in this hospital and prefers to stay here. Dr. Orlie Dakin also recommended same. Will monitor in the CCU.  3. Hypotension likely due to dehydration. Will start on IV fluids and monitor cautiously while in the CCU.  4. Hyperlipidemia. Will continue Crestor.   TOTAL TIME TAKING CARE OF THIS PATIENT (CRITICAL CARE): 45 minutes.   The patient's CODE STATUS was extensively discussed. At first she indicated FULL CODE, although subsequently she came back and changed it to DO NOT RESUSCITATE. She remains at very high risk for  cardiorespiratory arrest. Will monitor her in the CCU for at least 24 hours. Will also provide seizure precaution, fall precaution, and monitor neuro checks every four hours. Once Dr. Orlie Dakin evaluates her, Palliative Care consult can be considered, although right now will hold off as per Dr. Milinda Cave request. She has extremely poor prognosis. The patient and her husband are aware of such.    ____________________________ Ahleah Simko S. Sherryll Burger, MD vss:drc D: 01/12/2012 19:52:28 ET T: 01/13/2012 07:59:39 ET JOB#: [id number]  cc: Rykar Lebleu S. Sherryll Burger, MD, <Dictator> Steele Sizer, MD [facility] MD ELECTRONICALLY SIGNED 01/13/2012 15:17

## 2015-02-25 NOTE — Consult Note (Signed)
History of Present Illness:   Reason for Consult I was asked to see this patient in consultation at the request of Dr. Grayland Ormond for evaluation of a right frontal brain lesion.    HPI   Hailey Stanley is a very pleasant 64 year old woman with a history of metastatic melanoma.  She was initially diagnosed in 2009 when she had a wide local excision on the right foot.  She did not have any chemotherapy or radiation and was doing well until she developed a recurrence in her right shin and thigh with metastatic lesions to her liver, lung, and brain.  She underwent limb perfusion therapy at [facility] which was complicated with what sounds like compartment syndrome necessitating emergent surgical decompression.  She then established care at [facility] and underwent whole brain radiation with a dose of 3450 cGy in 15 fractions in June 2012.  She had since been doing fairly well on Zelboraf with good control of her systemic disease.  2 weeks ago, she was hospitalized after experiencing several falls, nausea, vomiting, and altered mental status.  Her friend, Mr. Radford Pax, who accompanies her today said that he found after one of her falls where she had an episode of incontinence and was told to bring her to the emergency room.  Upon review, she states that she knew that she had to go to the bathroom, but couldn't make it there.  She was admitted to [facility] and had an MRI which found a 4 cm enhancing lesion in the right frontal lobe with surrounding edema and mass effect concerning for possible tumor recurrence or possibly radiation necrosis.  She was started on steroid medications and states that she feels much better.    PFSH:   Family History noncontributory, positive    Comments Mother with colon cancer.    Comments Accompanied by her friend, Mr. Radford Pax, who has been with her for many years and cares for her. 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   NURSING NOTES: *CC  Vital Signs Flowsheet:   20-Mar-13 15:17    Temp Temperature: 97    Pulse Pulse: 70    Respirations Respirations: 18    SBP SBP: 112    DBP DBP: 75    Pain Scale (0-10): 0    Height (cm) centimeters: 149.8   Physical Exam:   Physical Exam Patient is awake, appears somewhat tired, sitting in a wheelchair. Speech is fluent and conversation is appropriate. Eyes open, pupils isocoric and reactive ou, midline, with conjugate gaze. Face symmetric. Strength is diffusely 4/5 on the left UE and 5/5 in her R UE and B LE.  Sensation was intact to light touch. Slight L UE pronator drift.    Penicillin: Hives    Zelboraf 240 mg oral tablet: 2 tab(s) orally 2 times a day, Active, 120, 4   Mag64 112 mg-64 mg tablet: 2 tab(s) orally once a day x 30 days, Active, 60, 5   potassium chloride 20 mEq oral tablet, extended release: 1 tab(s) orally once a day x 30 days, Active, 30, 5   Crestor 10 mg oral tablet: 1  orally once a day , Active, 0, None   Decadron: 10 milligram(s) orally every 6 hours, Active, 0, None  Routine Hem:  07-Jan-13 09:14    Lymphocyte # 1.2  19-Feb-13 10:18    WBC (CBC) 5.5   RBC (CBC) 4.23   Hemoglobin (CBC) 12.1   Hematocrit (CBC) 35.3  Platelet Count (CBC) 145   MCV 83   MCH 28.7   MCHC 34.4   RDW 14.5   Neutrophil % 75.3   Lymphocyte % 18.9   Monocyte % 4.5   Eosinophil % 1.1   Basophil % 0.2   Neutrophil # 4.1   Lymphocyte # 1.0   Monocyte # 0.2   Eosinophil # 0.1   Basophil # 0.0  Routine Chem:  19-Feb-13 10:18    Glucose, Serum 98   BUN 26   Creatinine (comp) 1.22   Sodium, Serum 141   Potassium, Serum 3.4   Chloride, Serum 102   CO2, Serum 29   Calcium (Total), Serum 9.9  Hepatic:  19-Feb-13 10:18    Bilirubin, Total 1.1   Alkaline Phosphatase 139   SGPT (ALT) 31   SGOT (AST) 23   Total Protein, Serum 8.2   Albumin, Serum 4.4  Routine Chem:  19-Feb-13 10:18    Osmolality (calc) 286   eGFR (African American) 58   eGFR  (Non-African American) 48   Anion Gap 10  Thyroid:  19-Feb-13 10:18    Thyroid Stimulating Hormone 2.57   Thyroxine, Free 1.20  Routine Chem:  19-Feb-13 10:18    Magnesium, Serum 1.8   Phosphorus, Serum 2.9     11-Mar-13 16:10, MRI Brain  With/Without Contrast    11-Mar-13 17:18, CT Head Without Contrast   Assessment and Plan:  Impression:   I spent > 60 minutes in direct face to face time with the patient and her friend.  We discussed my concern that this right frontal lesion was likely a new metastatic focus of her melanoma.  We discussed that there was a slight possibility that it was radiation necrosis, though this would be somewhat unusual given that she had whole brain radiation and this appears to be the only suggestive area.  I discussed with her that given the amount of edema and mass effect that I would recommend surgical excision.  This would allow [place] to obtain a diagnosis and would likely improve the surrounding edema which may allow her to be weaned from her steroids.  She is very unsure about a surgery and specifically would not like to be [facility] as she had a poor experience at [facility].  I told her that I did not have the capacity to perform the surgery at [facility], but would do my best to ensure that she had a positive experience at [facility].  I would plan to have her precare labs and EKG performed at [facility] and brought with her the day of surgery where I would obtain an MRI the morning of.  I discussed with her that I would do my best to resect as much of the tumor that I could safely, but that all surgery, as all treatment or even observation entails some risk.  We discussed the risks of bleeding, infection, incomplete resection, inability to cure her disease, seizures, and brain swelling.  She understands these risks and will contemplate the role of surgery.  She also is interested in considering further radiation as was discussed when she was an  inpatient.    Plan:   I discussed her visit with Dr. Grayland Ormond who will work to coordinate the appropriate follow up depending on what she decides regarding her treatment.  Thank you for allowing me to participate in the care of this patient.  Electronic Signatures: Ramon Dredge (MD)  (Signed 20-Mar-13 16:49)  Authored: HISTORY OF  PRESENT ILLNESS, PFSH, NURSING NOTES, PE, ALLERGIES, HOME MEDICATIONS, LABS, OTHER RESULTS, ASSESSMENT AND PLAN   Last Updated: 20-Mar-13 16:49 by Ramon Dredge (MD)

## 2015-02-25 NOTE — Consult Note (Signed)
History of Present Illness:   Reason for Consult Recurrent melanoma with brain metastasis.    HPI   Patient admitted overnight to the ER with 2-3 weeks of dizziness, falling, and confusion.  MRI the brain revealed significant edema with midline shift as well as a new metastasis.  Patient was initiated on 10 mg IV Decadron every 6 hours with significant improvement of her neurologic symptoms.  She is still seems slightly confused, but closer to her baseline.  She offers no further complaints.   PFSH:   Additional Past Medical and Surgical History Past medical history: Hyperlipidemia, hypertension.  Past surgical history: Melanoma removal.  Family history: Mother with colon cancer.  Social history: Patient denies tobacco or alcohol.  Works as a Geneticist, molecular.   Review of Systems:   Performance Status (ECOG) 2    Review of Systems   As per HPI. Otherwise, 10 point system review was negative.  NURSING NOTES: **Vital Signs.:   12-Mar-13 12:00    Temperature Temperature (F): 98.1    Celsius: 36.7    Temperature Source: oral    Pulse Pulse: 96    Respirations Respirations: 26    Systolic BP Systolic BP: 401    Diastolic BP (mmHg) Diastolic BP (mmHg): 53    Mean BP: 69    Pulse Ox % Pulse Ox %: 97    Oxygen Delivery: Room Air/ 21 %    Pulse Ox Heart Rate: 98   Physical Exam:   Physical Exam General: Ill-appearing, no acute distress. Eyes: Anicteric sclera. Lungs: Clear to auscultation bilaterally. Heart: Regular rate and rhythm. No rubs, murmurs, or gallops. Abdomen: Soft, normoactive bowel sounds. Extremities: Right leg surgical wound completely healed without erythema. Neuro: Alert. Cranial nerves grossly intact.  Patient appears nonfocal. Skin: No rashes or petechiae noted. Psych: Normal affect.    Penicillin: Hives    Zelboraf 240 mg oral tablet: 2 tab(s) orally 2 times a day, Active, 120, 4   Mag64 112 mg-64 mg tablet: 2 tab(s) orally  once a day x 30 days, Active, 60, 5   potassium chloride 20 mEq oral tablet, extended release: 1 tab(s) orally once a day x 30 days, Active, 30, 5   Crestor 10 mg oral tablet: 1  orally once a day , Active, 0, None   hydrochlorothiazide-triamterene 25 mg-37.5 mg oral tablet: 1  orally once a day , Active, 0, None  Routine Hem:  12-Mar-13 03:44    WBC (CBC) 3.6   RBC (CBC) 3.75   Hemoglobin (CBC) 10.8   Hematocrit (CBC) 31.8   Platelet Count (CBC) 139   MCV 85   MCH 28.7   MCHC 33.8   RDW 14.9   Neutrophil % 88.0   Lymphocyte % 11.5   Monocyte % 0.3   Eosinophil % 0.0   Basophil % 0.2   Neutrophil # 3.1   Lymphocyte # 0.4   Monocyte # 0.0   Eosinophil # 0.0   Basophil # 0.0  Routine Chem:  12-Mar-13 03:44    Glucose, Serum 147   BUN 26   Creatinine (comp) 1.27   Sodium, Serum 141   Potassium, Serum 3.7   Chloride, Serum 104   CO2, Serum 22   Calcium (Total), Serum 9.5   Osmolality (calc) 289   eGFR (African American) 55   eGFR (Non-African American) 46   Anion Gap 15  Routine Coag:  12-Mar-13 03:44    Prothrombin 13.2   INR 1.0  Routine Chem:  12-Mar-13 03:44    Magnesium, Serum 1.7   Assessment and Plan:  Impression:   Stage IV melanoma, now with recurrence in brain.  Plan:   1.  Melanoma:  MRI with new brain metastasis consistent with recurrent melanoma.  IV Decadron has significantly improved and neurologic symptoms. Continue with 10 mg IV every 6 hours. Radiation oncology has been consulted and will likely start palliative XRT later this week.  Continue Decadron as ordered.  Previously, PET scan revealed no evidence of disease.  Patient has been instructed to hold her Zelboraf 480 mg every 12 hours.  Will order repeat PET scan for tomorrow to assess for any other disease. consult, will follow.  Electronic Signatures: Delight Hoh (MD)  (Signed 12-Mar-13 14:00)  Authored: HISTORY OF PRESENT ILLNESS, PFSH, ROS, NURSING NOTES, PE, ALLERGIES, HOME  MEDICATIONS, LABS, ASSESSMENT AND PLAN   Last Updated: 12-Mar-13 14:00 by Delight Hoh (MD)
